# Patient Record
Sex: Female | Born: 1940 | Hispanic: No | Marital: Single
Health system: Southern US, Community
[De-identification: ages and names within clinical notes are randomized; demographics above are authoritative.]

---

## 2001-03-13 ENCOUNTER — Encounter (INDEPENDENT_AMBULATORY_CARE_PROVIDER_SITE_OTHER): Payer: Self-pay | Admitting: Specialist

## 2001-03-13 ENCOUNTER — Ambulatory Visit (HOSPITAL_COMMUNITY): Admission: RE | Admit: 2001-03-13 | Discharge: 2001-03-13 | Payer: Self-pay | Admitting: Gastroenterology

## 2001-04-28 ENCOUNTER — Emergency Department (HOSPITAL_COMMUNITY): Admission: EM | Admit: 2001-04-28 | Discharge: 2001-04-28 | Payer: Self-pay | Admitting: Emergency Medicine

## 2001-04-28 ENCOUNTER — Encounter: Payer: Self-pay | Admitting: Emergency Medicine

## 2007-07-20 ENCOUNTER — Other Ambulatory Visit: Admission: RE | Admit: 2007-07-20 | Discharge: 2007-07-20 | Payer: Self-pay | Admitting: Family Medicine

## 2007-12-26 ENCOUNTER — Encounter: Admission: RE | Admit: 2007-12-26 | Discharge: 2008-01-18 | Payer: Self-pay | Admitting: Orthopedic Surgery

## 2011-02-12 NOTE — Procedures (Signed)
[place]  Patient:    Monique Sloan, Monique Sloan                       MRN: [id number] Proc. Date: 03/13/01 Attending:  Petra Kuba, M.D. CC:         Vikki Ports, M.D., [facility]   Procedure Report  PROCEDURE:  Colonoscopy with polypectomy.  INDICATION:  Patient with a family history of colon cancer, due for repeat screening.  Consent was signed after risks, benefits, methods, and options thoroughly discussed in the past.  MEDICATIONS:  Demerol 50 mg, Versed 5 mg.  DESCRIPTION OF PROCEDURE:  Rectal inspection was pertinent for external hemorrhoids.  Digital exam was negative.  Video pediatric colonoscope was inserted, fairly easily advanced around the colon to the cecum.  This did require some abdominal pressure but no position changes.  The cecum was identified by the appendiceal orifice and the ileocecal valve.  Prep was adequate.  There was some liquid stool that required washing and suctioning. On insertion, left and right-sided diverticula were seen.  On slow withdrawal, the diverticula were confirmed.  There was a moderate amount on both sides. In the more distal transverse colon, a 3-4 mm polyp was seen and snared, electrocautery applied, and the polyp was suctioned through the scope and collected in the trap.  The scope was further withdrawn.  There was one descending and one tiny sigmoid polyp that were hot biopsied x 1 each.  No other abnormalities were seen as we slowly withdrew back to the rectum.  Once back in the rectum, the scope was retroflexed, pertinent for some internal hemorrhoids.  Two had some nodularity, some polypoid slight ulceration and erythema, and one was hot biopsied and then a few cold biopsies were obtained. There was no significant bleeding.  The scope was then straightened. Anorectal pull-through confirmed the above.  The scope was reinserted a short way up the sigmoid, air was suctioned, scope removed.   Patient tolerated the procedure well.  There was no obvious immediate complication.  ENDOSCOPIC DIAGNOSES: 1. Internal-external hemorrhoids with two questionable ulcerations versus    polypoid areas on the hemorrhoids, status post biopsy and one hot biopsy. 2. Three tiny to small polyps, two hot biopsied in the sigmoid and descending,    one snared in the distal transverse. 3. Left and right mild to moderate diverticula. 4. Otherwise within normal limits to the cecum.  PLAN:  Await pathology to determine future colonic screening.  Pending rectal biopsies, consider a surgical consult for hemorrhoidectomy or surgical removal.  Will give the one week customary postpolypectomy instructions and otherwise return care to Dr. Theresia Lo for the customary health care maintenance to include yearly rectals and guaiacs. DD:  03/13/01 TD:  03/13/01 Job: [id number]

## 2011-05-06 ENCOUNTER — Other Ambulatory Visit: Payer: Self-pay | Admitting: Gastroenterology

## 2012-05-02 ENCOUNTER — Other Ambulatory Visit: Payer: Self-pay | Admitting: Gastroenterology

## 2012-05-02 DIAGNOSIS — R109 Unspecified abdominal pain: Secondary | ICD-10-CM

## 2012-05-04 ENCOUNTER — Ambulatory Visit
Admission: RE | Admit: 2012-05-04 | Discharge: 2012-05-04 | Disposition: A | Discharge status: Home / Self Care | Payer: Medicare Other | Source: Ambulatory Visit | Attending: Gastroenterology | Admitting: Gastroenterology

## 2012-05-04 DIAGNOSIS — R109 Unspecified abdominal pain: Secondary | ICD-10-CM

## 2012-05-04 MED ORDER — IOHEXOL 300 MG/ML  SOLN
100.0000 mL | Freq: Once | INTRAMUSCULAR | Status: AC | PRN
  Administered 2012-05-04: 100 mL via INTRAVENOUS

## 2012-05-11 ENCOUNTER — Ambulatory Visit (HOSPITAL_COMMUNITY)
Admission: RE | Admit: 2012-05-11 | Discharge: 2012-05-11 | Disposition: A | Discharge status: Home / Self Care | Payer: Medicare Other | Source: Ambulatory Visit | Attending: Urology | Admitting: Urology

## 2012-05-11 ENCOUNTER — Other Ambulatory Visit (HOSPITAL_COMMUNITY): Payer: Self-pay | Admitting: Urology

## 2012-05-11 DIAGNOSIS — C649 Malignant neoplasm of unspecified kidney, except renal pelvis: Secondary | ICD-10-CM

## 2013-12-05 ENCOUNTER — Other Ambulatory Visit: Payer: Self-pay | Admitting: Gastroenterology

## 2013-12-05 DIAGNOSIS — R634 Abnormal weight loss: Secondary | ICD-10-CM

## 2013-12-05 DIAGNOSIS — R109 Unspecified abdominal pain: Secondary | ICD-10-CM

## 2013-12-10 ENCOUNTER — Ambulatory Visit
Admission: RE | Admit: 2013-12-10 | Discharge: 2013-12-10 | Disposition: A | Discharge status: Home / Self Care | Payer: Medicare Other | Source: Ambulatory Visit | Attending: Gastroenterology | Admitting: Gastroenterology

## 2013-12-10 DIAGNOSIS — R109 Unspecified abdominal pain: Secondary | ICD-10-CM

## 2013-12-10 DIAGNOSIS — R634 Abnormal weight loss: Secondary | ICD-10-CM

## 2013-12-10 MED ORDER — IOHEXOL 300 MG/ML  SOLN
100.0000 mL | Freq: Once | INTRAMUSCULAR | Status: AC | PRN
  Administered 2013-12-10: 100 mL via INTRAVENOUS

## 2014-09-30 IMAGING — CT CT ABD-PELV W/ CM
2 of 5 series · 16 of 46 positions shown, 18 images · IV contrast (omnipaque)
Comparison: CT ABD/PELVIS W CM dated 05/04/2012

CLINICAL DATA: Abdominal pain and weight loss.  Hysterectomy.

EXAM:
CT ABDOMEN AND PELVIS WITH CONTRAST
TECHNIQUE: Multidetector CT imaging of the abdomen and pelvis was performed
using the standard protocol following bolus administration of
intravenous contrast.
CONTRAST:  100mL OMNIPAQUE IOHEXOL 300 MG/ML  SOLN

[Series 2: abd/pelvis with · axial · 0.65mm/px · z∈[-398,-64]mm · 13 of 77 slices shown, 15 images]
[im 5/77  soft-tissue]
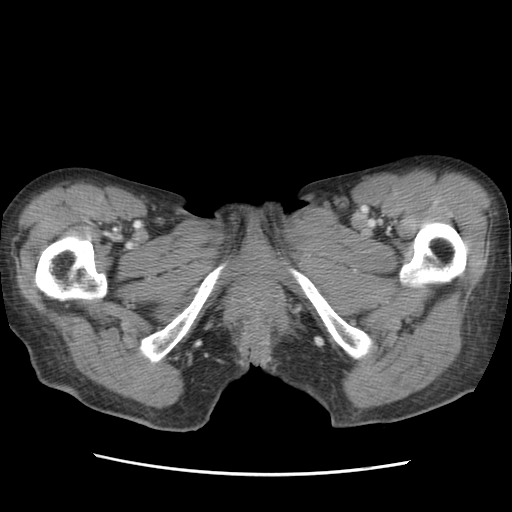
[im 5/77  bone]
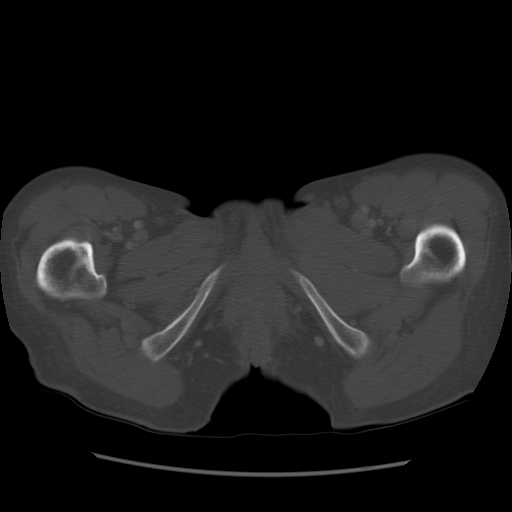
[im 9/77  soft-tissue]
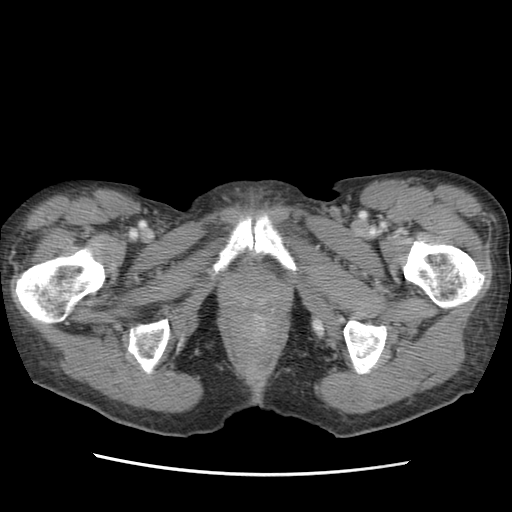
[im 18/77  soft-tissue]
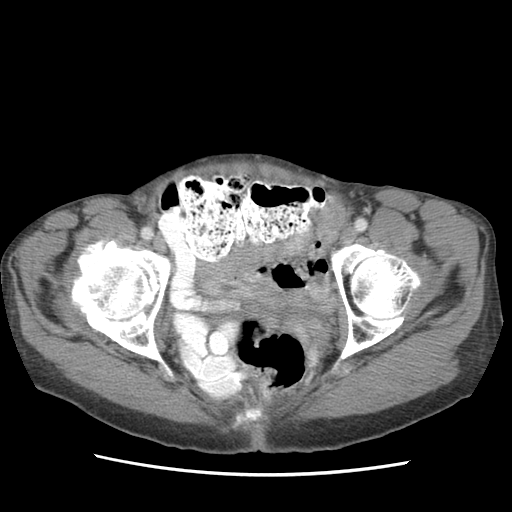
[im 23/77  soft-tissue]
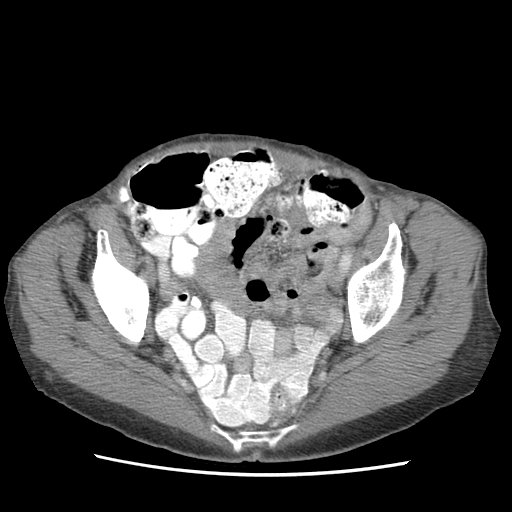
[im 27/77  soft-tissue]
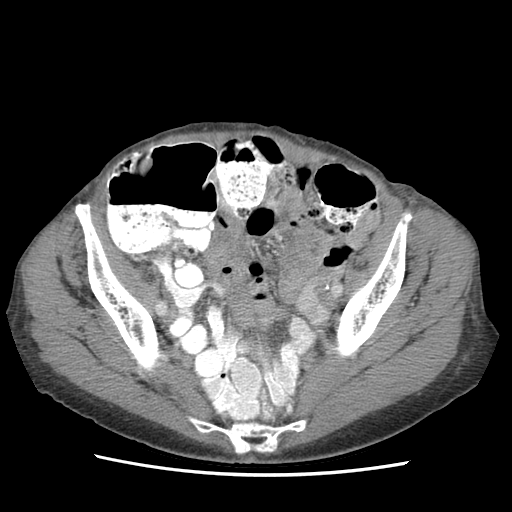
[im 32/77  soft-tissue]
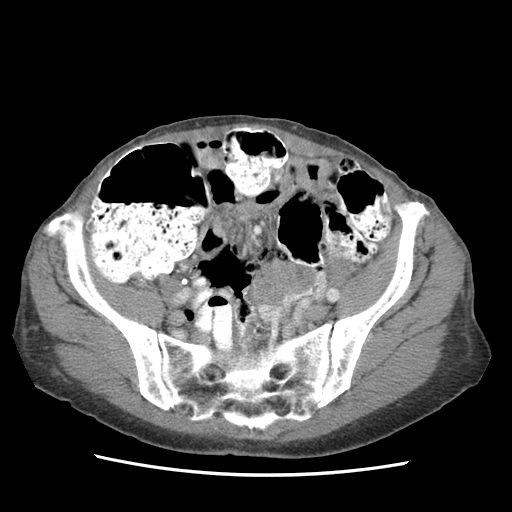
[im 41/77  soft-tissue]
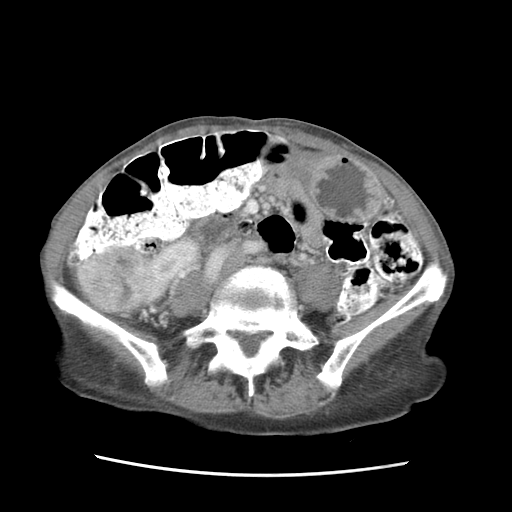
[im 45/77  soft-tissue]
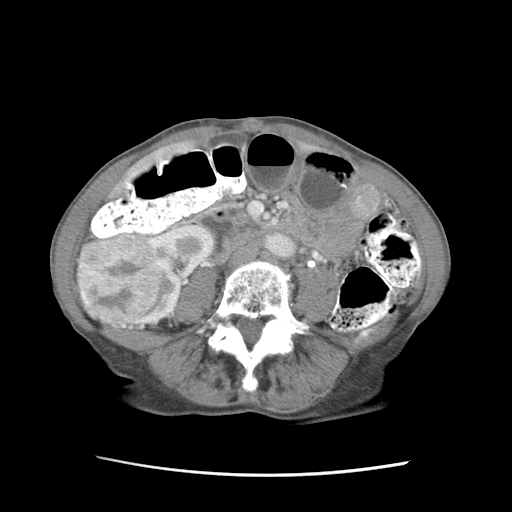
[im 50/77  soft-tissue]
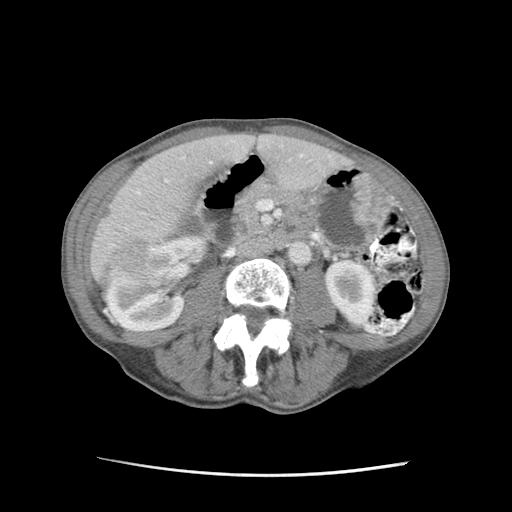
[im 50/77  bone]
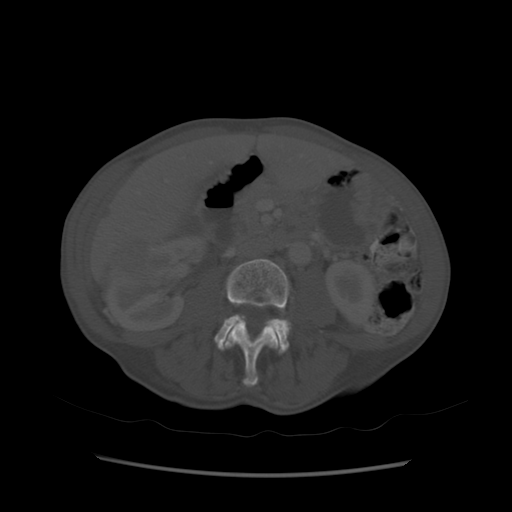
[im 54/77  soft-tissue]
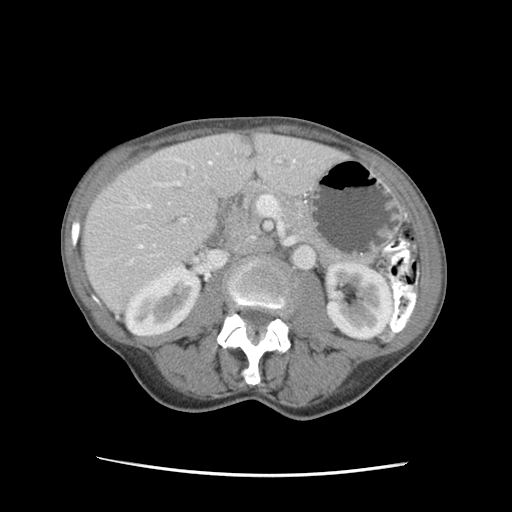
[im 59/77  soft-tissue]
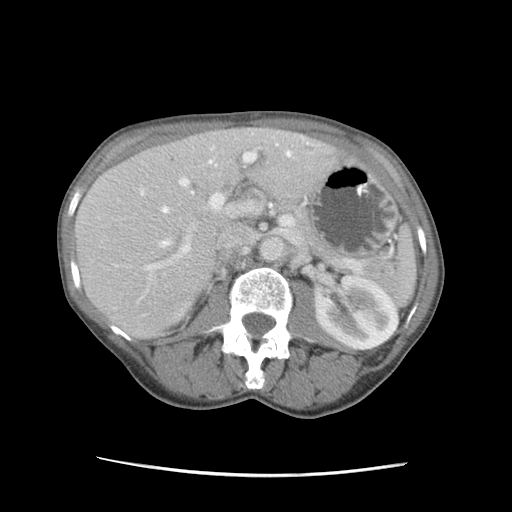
[im 68/77  soft-tissue]
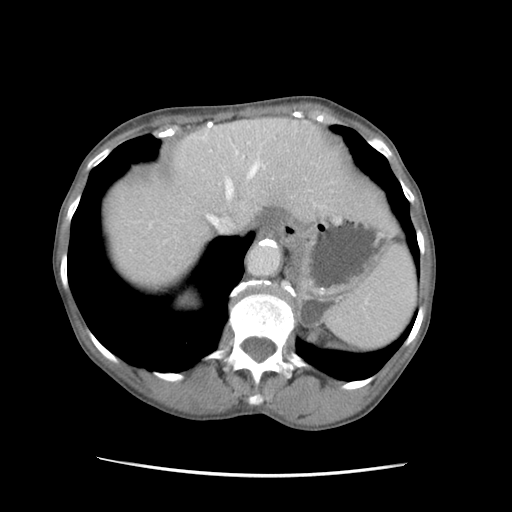
[im 72/77  soft-tissue]
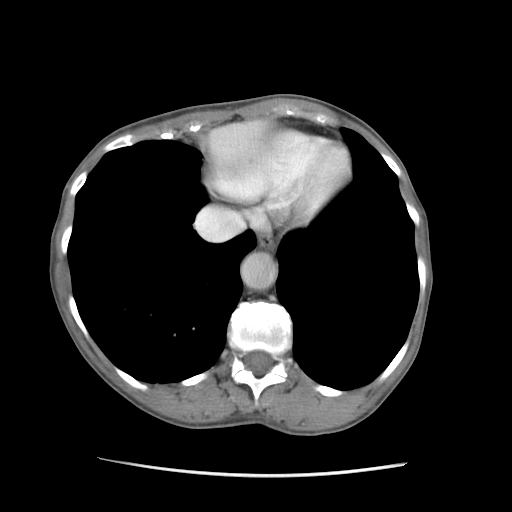

[Series 401: cor · coronal · 0.69mm/px · 3 of 96 slices shown]
[im 32/96  soft-tissue]
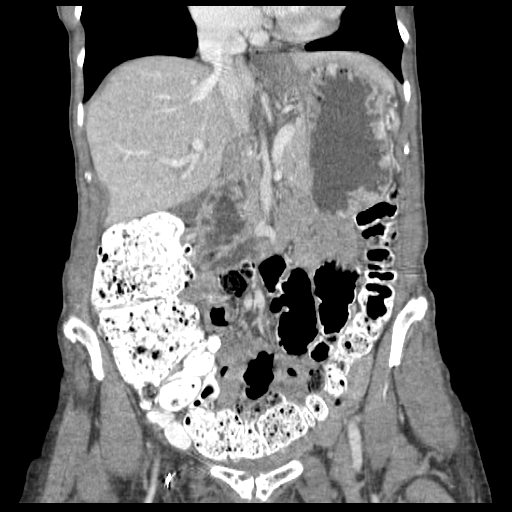
[im 43/96  soft-tissue]
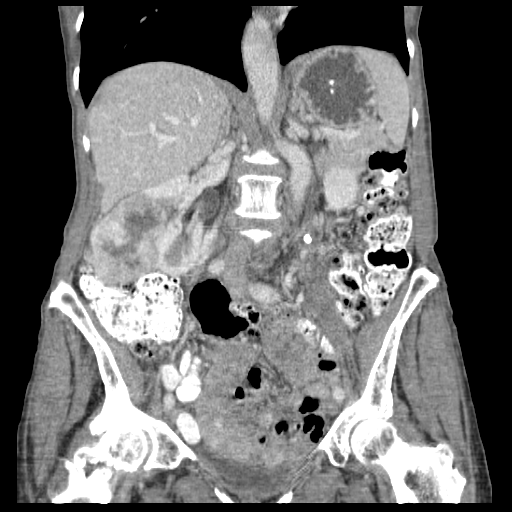
[im 53/96  soft-tissue]
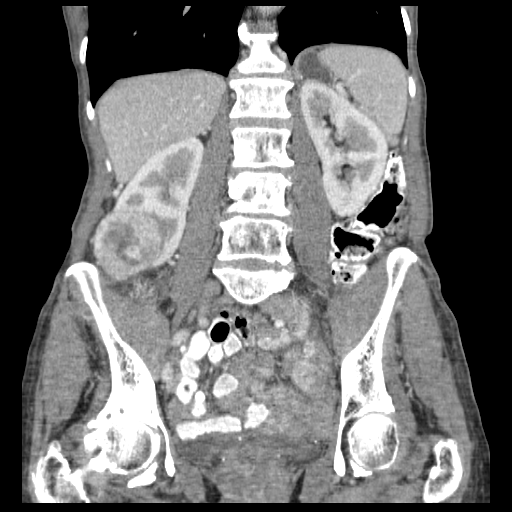

[16 of 46 positions shown; findings below may reference images not displayed]

FINDINGS: Lower Chest: Lingular scarring. Normal heart size without
pericardial or pleural effusion.

Abdomen/Pelvis: Scattered tiny liver lesions which are
well-circumscribed and likely small cysts or bile duct hamartomas.
Many of these are evident on the prior exam.

Normal spleen. Posterior gastric diverticulum. Underdistended
portions of the gastric body. Apparent wall thickening is felt to be
secondary. Example coronal image 25 and coronal image 17 within the
body. Normal pancreas. Exam mildly degraded by paucity of
intra-abdominal fat. Normal gallbladder, biliary tract, adrenal
glands. Normal left kidney.

Heterogeneously enhancing mass off the lateral aspect inter/lower
pole right kidney. Measures 6.0 x 5.6 cm on image 32. Compare 5.0 x
4.8 cm at the same level on the prior exam. 6.0 cm craniocaudal
today versus 5.0 cm on the prior. No collecting system complication
on delayed images. Right renal vein patent. No definite
retroperitoneal adenopathy, given limitations of diminished
abdominal fat.

Scattered colonic diverticula. Normal terminal ileum and appendix.
No gross small bowel abnormality. No ascites.

No pelvic adenopathy. Hysterectomy. Normal urinary bladder. No
definite adnexal mass or significant free pelvic fluid. Surgical
changes in the right inguinal region. Subtle nodularity about the
umbilicus on image 34/series 2.

Bones/Musculoskeletal: Degenerative irregularity of the symphysis
pubis. Advanced right hip osteoarthritis, chronic.
IMPRESSION: 1. Decrease sensitivity exam, secondary to paucity of abdominal fat.
2. Enlargement of in inter/lower pole right renal mass, most
consistent with renal cell carcinoma. No right renal vein
involvement or convincing evidence of metastatic disease. These
results will be called to the ordering clinician or representative
by the Radiologist Assistant, and communication documented in the
PACS Dashboard.
3. Areas of apparent gastric wall thickening which are favored to be
due to underdistention. Clinical history describes weight loss. If
there is a clinical concern of gastric pathology, upper GI or
endoscopy should be considered.
4. Subtle nodularity about the umbilicus. Consider physical exam
correlation. Umbilical nodes are not in the typical drainage pattern
of renal neoplasms.

## 2022-04-27 DEATH — deceased
